# Patient Record
Sex: Male | Born: 1995 | Race: White | Hispanic: No | Marital: Single | Smoking: Never smoker
Health system: Southern US, Community
[De-identification: ages and names within clinical notes are randomized; demographics above are authoritative.]

---
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
## 2015-05-01 ENCOUNTER — Encounter (HOSPITAL_COMMUNITY): Payer: Self-pay | Admitting: *Deleted

## 2015-05-01 ENCOUNTER — Emergency Department (HOSPITAL_COMMUNITY): Payer: BLUE CROSS/BLUE SHIELD

## 2015-05-01 ENCOUNTER — Emergency Department (HOSPITAL_COMMUNITY)
Admission: EM | Admit: 2015-05-01 | Discharge: 2015-05-02 | Disposition: A | Discharge status: Home / Self Care | Payer: BLUE CROSS/BLUE SHIELD | Attending: Emergency Medicine | Admitting: Emergency Medicine

## 2015-05-01 DIAGNOSIS — T368X5A Adverse effect of other systemic antibiotics, initial encounter: Secondary | ICD-10-CM | POA: Insufficient documentation

## 2015-05-01 DIAGNOSIS — Y998 Other external cause status: Secondary | ICD-10-CM | POA: Diagnosis not present

## 2015-05-01 DIAGNOSIS — T7840XA Allergy, unspecified, initial encounter: Secondary | ICD-10-CM | POA: Diagnosis not present

## 2015-05-01 DIAGNOSIS — Y9289 Other specified places as the place of occurrence of the external cause: Secondary | ICD-10-CM | POA: Diagnosis not present

## 2015-05-01 DIAGNOSIS — X58XXXA Exposure to other specified factors, initial encounter: Secondary | ICD-10-CM | POA: Diagnosis not present

## 2015-05-01 DIAGNOSIS — Y9389 Activity, other specified: Secondary | ICD-10-CM | POA: Diagnosis not present

## 2015-05-01 MED ORDER — DIPHENHYDRAMINE HCL 50 MG/ML IJ SOLN
12.5000 mg | Freq: Once | INTRAMUSCULAR | Status: AC
  Administered 2015-05-01: 12.5 mg via INTRAVENOUS
  Filled 2015-05-01: qty 1

## 2015-05-01 MED ORDER — METHYLPREDNISOLONE SODIUM SUCC 125 MG IJ SOLR
125.0000 mg | Freq: Once | INTRAMUSCULAR | Status: AC
  Administered 2015-05-01: 125 mg via INTRAVENOUS
  Filled 2015-05-01: qty 2

## 2015-05-01 MED ORDER — FAMOTIDINE IN NACL 20-0.9 MG/50ML-% IV SOLN
20.0000 mg | Freq: Once | INTRAVENOUS | Status: AC
  Administered 2015-05-01: 20 mg via INTRAVENOUS
  Filled 2015-05-01: qty 50

## 2015-05-01 NOTE — ED Notes (Signed)
Pt states he took a levaquin x pta and is c/o itching, swelling of the eyes and throat feels itchy and swollen

## 2015-05-01 NOTE — ED Notes (Signed)
Patient transported to X-ray 

## 2015-05-01 NOTE — ED Notes (Signed)
Pt reports acute onset of facial swelling and generalized pruritis this evening approx after taking his first dose of levaquin - pt states he has a had cold x2 months and his PCP just prescribed this for him, no known history of allergies prior to this evening. Pt in no acute distress, speaking complete sentences, a few hives noted to pt's upper back - pt also admits to some tightness in throat however does not feel swollen, no hoarseness noted. Pt took claritin at home pta.

## 2015-05-02 MED ORDER — PREDNISONE 20 MG PO TABS: ORAL_TABLET | ORAL | Status: DC

## 2015-05-02 NOTE — ED Notes (Signed)
Pt ambulating independently w/ steady gait on d/c in no acute distress, A&Ox4. D/c instructions reviewed w/ pt and family - pt and family deny any further questions or concerns at present. Rx given x1  

## 2015-05-02 NOTE — Discharge Instructions (Signed)
Drug Allergy °A drug allergy means you have a strange reaction to a medicine. You may have puffiness (swelling), itching, red rashes, and hives. Some allergic reactions can be life-threatening. °HOME CARE  °If you do not know what caused your reaction: °· Write down medicines you use. °· Write down any problems you have after using medicine. °· Avoid things that cause a reaction. °· You can see an allergy doctor to be tested for allergies. °If you have hives or a rash: °· Take medicine as told by your doctor. °· Place cold cloths on your skin. °· Do not take hot baths or hot showers. Take baths in cool water. °If you are severely allergic: °· Wear a medical bracelet or necklace that lists your allergy. °· Carry your allergy kit or medicine shot to treat severe allergic reactions with you. These can save your life. °· Do not drive until medicine from your shot has worn off, unless your doctor says it is okay. °GET HELP RIGHT AWAY IF:  °· Your mouth is puffy, or you have trouble breathing. °· You have a tight feeling in your chest or throat. °· You have hives, puffiness, or itching all over your body. °· You throw up (vomit) or have watery poop (diarrhea). °· You feel dizzy or pass out (faint). °· You think you are having a reaction. Problems often start within 30 minutes after taking a medicine. °· You are getting worse, not better. °· You have new problems. °· Your problems go away and then come back. °This is an emergency. Use your medicine shot or allergy kit as told. Call your local emergency services (911 in [place] after the shot. Even if you feel better after the shot, you need to go to the hospital. You may need more medicine to control a severe reaction. °MAKE SURE YOU: °· Understand these instructions. °· Will watch your condition. °· Will get help right away if you are not doing well or get worse. °Document Released: 01/16/2005 Document Revised: 03/02/2012 Document Reviewed: 06/06/2011 °ExitCare® Patient  Information ©2015 [employer]. This information is not intended to replace advice given to you by your health care provider. Make sure you discuss any questions you have with your health care provider. ° °

## 2015-05-03 NOTE — ED Provider Notes (Signed)
CSN: [id number]     Arrival date & time 05/01/15  2152 History   First MD Initiated Contact with Patient 05/01/15 2250     Chief Complaint  Patient presents with  . Allergic Reaction     (Consider location/radiation/quality/duration/timing/severity/associated sxs/prior Treatment) HPI  Jamie Waller is a 19 y.o. male who presents to the Emergency Department complaining of sudden onset of red splotchy rash, facial swelling around his eyes, and generalized itching that began 45 minutes after taking Levaquin for first time.  He states that he was treated for "cold" type symptoms for two months and given the antibiotic for a possible sinus infection.  He reports developing itching and swelling around his eyes and feeling tightness in his throat.  He tool a Claritin with some relief of his symptoms prior to ED arrival.  He denies shortness of breath, difficulty swallowing, lip swelling, chest pain, or vomiting.    History reviewed. No pertinent past medical history. History reviewed. No pertinent past surgical history. History reviewed. No pertinent family history. History  Substance Use Topics  . Smoking status: Never Smoker   . Smokeless tobacco: Not on file  . Alcohol Use: No    Review of Systems  Constitutional: Negative for fever, chills, activity change and appetite change.  HENT: Positive for congestion, facial swelling, rhinorrhea and sneezing. Negative for sore throat, trouble swallowing and voice change.   Respiratory: Negative for cough, shortness of breath and wheezing.   Gastrointestinal: Negative for nausea, vomiting and abdominal pain.  Musculoskeletal: Negative for back pain and neck pain.  Skin: Positive for rash.  Allergic/Immunologic: Negative for food allergies and immunocompromised state.  Neurological: Negative for dizziness, syncope and headaches.  All other systems reviewed and are negative.     Allergies  Levaquin  Home Medications   Prior to  Admission medications   Medication Sig Start Date End Date Taking? Authorizing Provider  Minocycline HCl (MINOCIN PO) Take by mouth.    Historical Provider, MD  predniSONE (DELTASONE) 20 MG tablet Two tabs po qd x 5 days 05/02/15   Mishael Krysiak, PA-C   BP 119/78 mmHg  Pulse 50  Temp(Src) 98 F (36.7 C) (Oral)  Resp 20  Ht 6\' 1"  (1.854 m)  Wt 180 lb (81.647 kg)  BMI 23.75 kg/m2  SpO2 100% Physical Exam  Constitutional: He appears well-developed and well-nourished. No distress.  HENT:  Head: Normocephalic and atraumatic.  Right Ear: Tympanic membrane and ear canal normal.  Left Ear: Tympanic membrane and ear canal normal.  Nose: Mucosal edema and rhinorrhea present.  Mouth/Throat: Uvula is midline, oropharynx is clear and moist and mucous membranes are normal. No trismus in the jaw. No uvula swelling.  Airway patent , no edema.  Mild STS of the bilateral periorbital edema  Eyes: Conjunctivae and EOM are normal. Pupils are equal, round, and reactive to light.  Neck: Normal range of motion. Neck supple.  Cardiovascular: Normal rate, regular rhythm, normal heart sounds and intact distal pulses.   No murmur heard. Pulmonary/Chest: Effort normal and breath sounds normal. No respiratory distress. He has no wheezes. He has no rales.  Abdominal: Soft. He exhibits no distension. There is no tenderness. There is no rebound.  Musculoskeletal: Normal range of motion.  Lymphadenopathy:    He has no cervical adenopathy.  Neurological: He is alert. He exhibits normal muscle tone. Coordination normal.  Skin: Skin is warm.  Mild dermagraphia present without rash on exam.  Psychiatric: He has a normal mood and  affect. Thought content normal.  Nursing note and vitals reviewed.   ED Course  Procedures (including critical care time) Labs Review Labs Reviewed - No data to display  Imaging Review Dg Chest 2 View  05/02/2015   CLINICAL DATA:  Allergic reaction  EXAM: CHEST  2 VIEW  COMPARISON:   None.  FINDINGS: The heart size and mediastinal contours are within normal limits. Both lungs are clear. The visualized skeletal structures are unremarkable.  IMPRESSION: No active cardiopulmonary disease.   Electronically Signed   By: Ellery Plunkaniel R Mitchell M.D.   On: 05/02/2015 00:02     EKG Interpretation None      MDM   Final diagnoses:  Allergic reaction caused by a drug    Patient with likely allergic rxn to Levaquin.  Airway has remained patent.  No angioedema.    He is feeling better after IV medications and symptoms appear to be improving.  He appears stable for d/c and agrees to close PMD f/u and given restrict return precautions.      Pauline Ausammy Fawaz Borquez, PA-C 05/03/15 1601  Benjiman CoreNathan Pickering, MD 05/04/15 [phone]

## 2015-12-26 IMAGING — DX DG CHEST 2V
2 series · 2 of 2 positions shown · non-contrast
Comparison: None.

CLINICAL DATA: Allergic reaction

EXAM:
CHEST  2 VIEW

[chest pa]
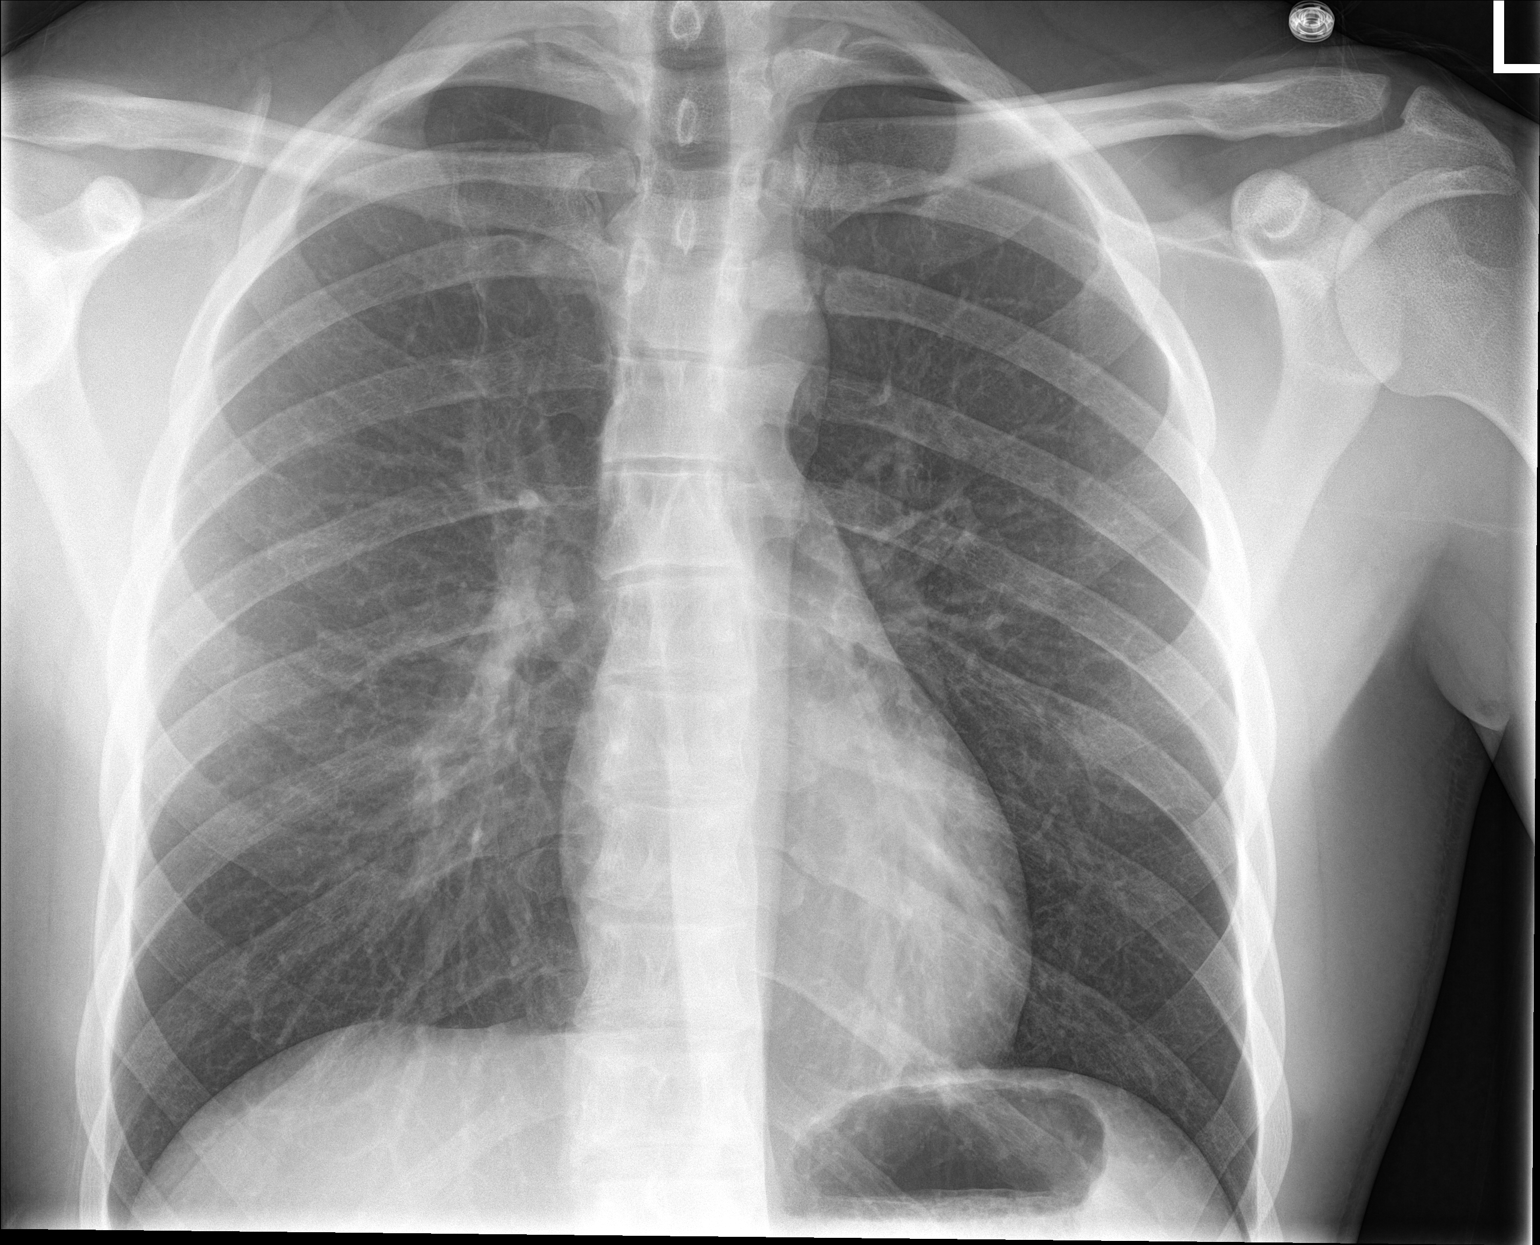

[chest lat]
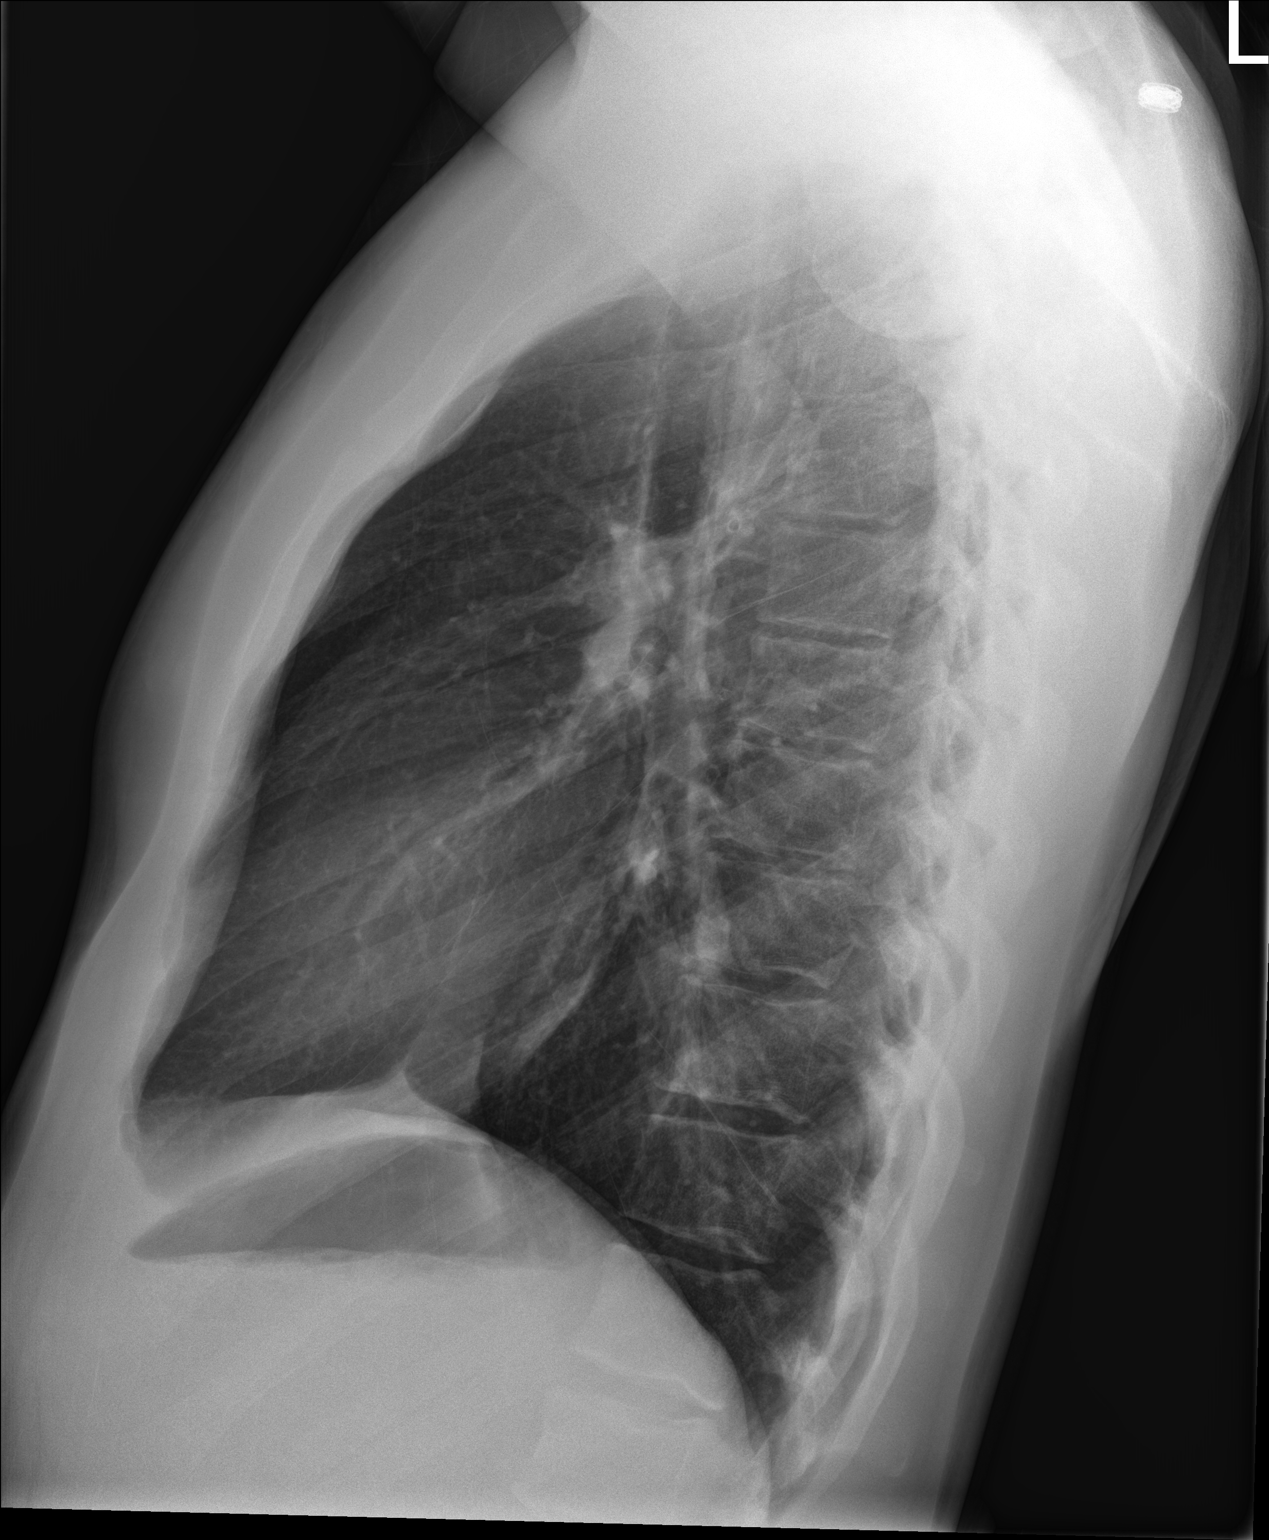

[2 of 2 positions shown; findings below may reference images not displayed]

FINDINGS: The heart size and mediastinal contours are within normal limits.
Both lungs are clear. The visualized skeletal structures are
unremarkable.
IMPRESSION: No active cardiopulmonary disease.

## 2018-04-14 ENCOUNTER — Telehealth: Payer: Self-pay

## 2018-04-14 NOTE — Telephone Encounter (Signed)
Patient mother states pt complained to her last night he was having chest pain and sob after eating on going for a month now. I spoke with Dr. Brett CanalesSteve regarding this advised we had no availability to see pt today,but mother of pt wants an appt for later in the week as the pt is not with her currently.Per Dr.Steve he thinks appt later at next available with be ok. I advised the mother is feels worse between now and then take to the ed.

## 2018-04-14 NOTE — Telephone Encounter (Signed)
ok 

## 2018-04-21 ENCOUNTER — Ambulatory Visit (INDEPENDENT_AMBULATORY_CARE_PROVIDER_SITE_OTHER): Payer: Managed Care, Other (non HMO) | Admitting: Family Medicine

## 2018-04-21 VITALS — BP 132/68 | Ht 73.0 in | Wt 199.4 lb

## 2018-04-21 DIAGNOSIS — R079 Chest pain, unspecified: Secondary | ICD-10-CM

## 2018-04-21 MED ORDER — PANTOPRAZOLE SODIUM 40 MG PO TBEC: 40.0000 mg | DELAYED_RELEASE_TABLET | Freq: Every day | ORAL | 3 refills | Status: AC

## 2018-04-21 MED ORDER — SUCRALFATE 1 G PO TABS: ORAL_TABLET | ORAL | 0 refills | Status: AC

## 2018-04-21 NOTE — Patient Instructions (Signed)
Gastroesophageal Reflux Disease, Adult Normally, food travels down the esophagus and stays in the stomach to be digested. However, when a person has gastroesophageal reflux disease (GERD), food and stomach acid move back up into the esophagus. When this happens, the esophagus becomes sore and inflamed. Over time, GERD can create small holes (ulcers) in the lining of the esophagus. What are the causes? This condition is caused by a problem with the muscle between the esophagus and the stomach (lower esophageal sphincter, or LES). Normally, the LES muscle closes after food passes through the esophagus to the stomach. When the LES is weakened or abnormal, it does not close properly, and that allows food and stomach acid to go back up into the esophagus. The LES can be weakened by certain dietary substances, medicines, and medical conditions, including:  Tobacco use.  Pregnancy.  Having a hiatal hernia.  Heavy alcohol use.  Certain foods and beverages, such as coffee, chocolate, onions, and peppermint.  What increases the risk? This condition is more likely to develop in:  People who have an increased body weight.  People who have connective tissue disorders.  People who use NSAID medicines.  What are the signs or symptoms? Symptoms of this condition include:  Heartburn.  Difficult or painful swallowing.  The feeling of having a lump in the throat.  Abitter taste in the mouth.  Bad breath.  Having a large amount of saliva.  Having an upset or bloated stomach.  Belching.  Chest pain.  Shortness of breath or wheezing.  Ongoing (chronic) cough or a night-time cough.  Wearing away of tooth enamel.  Weight loss.  Different conditions can cause chest pain. Make sure to see your health care provider if you experience chest pain. How is this diagnosed? Your health care provider will take a medical history and perform a physical exam. To determine if you have mild or severe  GERD, your health care provider may also monitor how you respond to treatment. You may also have other tests, including:  An endoscopy toexamine your stomach and esophagus with a small camera.  A test thatmeasures the acidity level in your esophagus.  A test thatmeasures how much pressure is on your esophagus.  A barium swallow or modified barium swallow to show the shape, size, and functioning of your esophagus.  How is this treated? The goal of treatment is to help relieve your symptoms and to prevent complications. Treatment for this condition may vary depending on how severe your symptoms are. Your health care provider may recommend:  Changes to your diet.  Medicine.  Surgery.  Follow these instructions at home: Diet  Follow a diet as recommended by your health care provider. This may involve avoiding foods and drinks such as: ? Coffee and tea (with or without caffeine). ? Drinks that containalcohol. ? Energy drinks and sports drinks. ? Carbonated drinks or sodas. ? Chocolate and cocoa. ? Peppermint and mint flavorings. ? Garlic and onions. ? Horseradish. ? Spicy and acidic foods, including peppers, chili powder, curry powder, vinegar, hot sauces, and barbecue sauce. ? Citrus fruit juices and citrus fruits, such as oranges, lemons, and limes. ? Tomato-based foods, such as red sauce, chili, salsa, and pizza with red sauce. ? Fried and fatty foods, such as donuts, french fries, potato chips, and high-fat dressings. ? High-fat meats, such as hot dogs and fatty cuts of red and white meats, such as rib eye steak, sausage, ham, and bacon. ? High-fat dairy items, such as whole milk,   butter, and cream cheese.  Eat small, frequent meals instead of large meals.  Avoid drinking large amounts of liquid with your meals.  Avoid eating meals during the 2-3 hours before bedtime.  Avoid lying down right after you eat.  Do not exercise right after you eat. General  instructions  Pay attention to any changes in your symptoms.  Take over-the-counter and prescription medicines only as told by your health care provider. Do not take aspirin, ibuprofen, or other NSAIDs unless your health care provider told you to do so.  Do not use any tobacco products, including cigarettes, chewing tobacco, and e-cigarettes. If you need help quitting, ask your health care provider.  Wear loose-fitting clothing. Do not wear anything tight around your waist that causes pressure on your abdomen.  Raise (elevate) the head of your bed 6 inches (15cm).  Try to reduce your stress, such as with yoga or meditation. If you need help reducing stress, ask your health care provider.  If you are overweight, reduce your weight to an amount that is healthy for you. Ask your health care provider for guidance about a safe weight loss goal.  Keep all follow-up visits as told by your health care provider. This is important. Contact a health care provider if:  You have new symptoms.  You have unexplained weight loss.  You have difficulty swallowing, or it hurts to swallow.  You have wheezing or a persistent cough.  Your symptoms do not improve with treatment.  You have a hoarse voice. Get help right away if:  You have pain in your arms, neck, jaw, teeth, or back.  You feel sweaty, dizzy, or light-headed.  You have chest pain or shortness of breath.  You vomit and your vomit looks like blood or coffee grounds.  You faint.  Your stool is bloody or black.  You cannot swallow, drink, or eat. This information is not intended to replace advice given to you by your health care provider. Make sure you discuss any questions you have with your health care provider. Document Released: 09/18/2005 Document Revised: 05/08/2016 Document Reviewed: 04/05/2015 Elsevier Interactive Patient Education  2018 [employer]  

## 2018-04-21 NOTE — Progress Notes (Signed)
   Subjective:    Patient ID: Jamie Waller, male    DOB: 1996-07-09, 22 y.o.   MRN: [id number]  Chest Pain   This is a new problem. The current episode started more than 1 month ago. The quality of the pain is described as tightness. The pain radiates to the left shoulder, left arm and upper back. Associated symptoms include back pain and shortness of breath. The pain is aggravated by food (lying down). He has tried antacids for the symptoms. The treatment provided mild relief. 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 Hopefully the years, reasonably well for the so gets all test later in the spring to try to pull.  With many years   uses occa  aspirin  Review of Systems  Respiratory: Positive for shortness of breath.   Cardiovascular: Positive for chest pain.  Musculoskeletal: Positive for back pain.       Objective:   Physical Exam  Alert and oriented, vitals reviewed and stable, NAD ENT-TM's and ext canals WNL bilat via otoscopic exam Soft palate, tonsils and post pharynx WNL via oropharyngeal exam Neck-symmetric, no masses; thyroid nonpalpable and nontender Pulmonary-no tachypnea or accessory muscle use; Clear without wheezes via auscultation Card--no abnrml murmurs, rhythm reg and rate WNL Carotid pulses symmetric, without bruits  EKG normal sinus rhythm.  No significant ST-T changes     Assessment & Plan:   1 impression chest pain.  Highly doubt cardiac etiology.  When originally presented started more postprandial.  Worse in the evening.  Was generalized.  Some symptoms of reflux.  Long discussion held.  Before major work-ups/referrals etc. I think reasonable to press on for a reflux esophagitis etiology.  Will initiate appropriate medication.  Educational information given.  H. pylori recommended yes 1 today, he has sure sure he rather low risk   Greater than 50% of this 25 minute face to face visit was spent in counseling and discussion and coordination of care regarding the above diagnosis/diagnosies

## 2018-04-23 LAB — HELICOBACTER PYLORI ABS-IGG+IGA, BLD: H. pylori, IgG AbS: <0.8 Index Value (ref 0.00–0.79)

## 2018-04-27 ENCOUNTER — Encounter: Payer: Self-pay | Admitting: Family Medicine

## 2018-05-12 ENCOUNTER — Ambulatory Visit (INDEPENDENT_AMBULATORY_CARE_PROVIDER_SITE_OTHER): Payer: Managed Care, Other (non HMO) | Admitting: Family Medicine

## 2018-05-12 ENCOUNTER — Encounter: Payer: Self-pay | Admitting: Family Medicine

## 2018-05-12 VITALS — BP 114/72 | Ht 73.0 in | Wt 200.8 lb

## 2018-05-12 DIAGNOSIS — K219 Gastro-esophageal reflux disease without esophagitis: Secondary | ICD-10-CM

## 2018-05-12 NOTE — Progress Notes (Signed)
   Subjective:    Patient ID: Jamie Waller, male    DOB: 02/23/1996, 22 y.o.   MRN: [id number]  HPI  Patient arrives for a follow up on reflux. Patient states he is doing better on medication.  Taking the med every day, has helped a whole lot  occas tightness in the throat area, overall doing a lot better  Patient compliant with medication.   and doing well.  Much less discomfort.  No noticeable side effects from the medication.  Review of Systems No headache, no major weight loss or weight gain, no chest pain no back pain abdominal pain no change in bowel habits complete ROS otherwise negative     Objective:   Physical Exam Alert vitals stable, NAD. Blood pressure good on repeat. HEENT normal. Lungs clear. Heart regular rate and rhythm. Abdomen no significant tenderness       Assessment & Plan:  Impression reflux clinically improved.  Tolerating the medication well.  Encouraged to switch to every other day Protonix for months and every third day if symptoms go away hold off on medicine and then use as needed.  Rationale discussed

## 2018-05-12 NOTE — Patient Instructions (Addendum)
After the first mont, you will finish the carafate, then try to back off you protonix to every other morning for a month, then back off to every third day   If after month of this doing well then stop and use as needed  Long term this may or may not work, you may end up needing med like this evey day but hte experts encourage [place] to try to use it more sparingly

## 2018-07-21 ENCOUNTER — Encounter: Payer: Self-pay | Admitting: Nurse Practitioner

## 2018-07-21 ENCOUNTER — Ambulatory Visit (INDEPENDENT_AMBULATORY_CARE_PROVIDER_SITE_OTHER): Payer: Managed Care, Other (non HMO) | Admitting: Nurse Practitioner

## 2018-07-21 VITALS — BP 116/72 | Temp 98.9°F | Ht 73.0 in | Wt 193.0 lb

## 2018-07-21 DIAGNOSIS — J029 Acute pharyngitis, unspecified: Secondary | ICD-10-CM | POA: Diagnosis not present

## 2018-07-21 LAB — POCT RAPID STREP A (OFFICE): RAPID STREP A SCREEN: NEGATIVE

## 2018-07-21 MED ORDER — AZITHROMYCIN 250 MG PO TABS: ORAL_TABLET | ORAL | 0 refills | Status: AC

## 2018-07-22 ENCOUNTER — Encounter: Payer: Self-pay | Admitting: Nurse Practitioner

## 2018-07-22 LAB — STREP A DNA PROBE: STREP GP A DIRECT, DNA PROBE: NEGATIVE

## 2018-07-22 LAB — SPECIMEN STATUS REPORT

## 2018-07-22 NOTE — Progress Notes (Signed)
Subjective: Presents for complaints of an illness that began about 10 days ago.  Began with sudden onset headache after being outside in the heat, states he stopped sweating.  Was worried about heat stroke.  Patient got into some air conditioning and took a cool shower.  Had a headache daily for about 3 to 4 days which has resolved.  Over the past several days has developed low-grade fever, max temp 99.6.  Worsening sore throat yesterday.  Headache.  No cough runny nose or ear pain.  No wheezing.  Nausea with vomiting x1.  No diarrhea or abdominal pain.  No rash.  Taking fluids well.  Voiding normal limit.  Objective:   BP 116/72   Temp 98.9 F (37.2 C) (Oral)   Ht 6\' 1"  (1.854 m)   Wt 193 lb (87.5 kg)   BMI 25.46 kg/m  NAD.  Alert, oriented.  TMs mild clear effusion, no erythema.  Pharynx moderately erythematous, RST negative.  Neck supple with mild soft anterior adenopathy.  Lungs clear.  Heart regular rate rhythm.  Assessment:  Acute pharyngitis, unspecified etiology - Plan: POCT rapid strep A, Strep A DNA probe    Plan:   Meds ordered this encounter  Medications  . azithromycin (ZITHROMAX Z-PAK) 250 MG tablet    Sig: Take 2 tablets (500 mg) on  Day 1,  followed by 1 tablet (250 mg) once daily on Days 2 through 5.    Dispense:  6 each    Refill:  0    Order Specific Question:   Supervising Provider    Answer:   Merlyn AlbertLUKING, WILLIAM S [[id number]]   Throat culture pending.  OTC meds as directed for symptomatic care.  Discussed prevention of heat related illness.  Call back by the end of the week if no improvement, sooner if worse.

## 2019-01-29 ENCOUNTER — Telehealth: Payer: Self-pay | Admitting: Family Medicine

## 2019-01-29 ENCOUNTER — Other Ambulatory Visit: Payer: Self-pay | Admitting: *Deleted

## 2019-01-29 MED ORDER — OSELTAMIVIR PHOSPHATE 75 MG PO CAPS: 75.0000 mg | ORAL_CAPSULE | Freq: Every day | ORAL | 0 refills | Status: AC

## 2019-01-29 NOTE — Telephone Encounter (Signed)
tamiflu 75 qqd for ten d

## 2019-01-29 NOTE — Telephone Encounter (Signed)
Pt has been exposed to the flu, not showing any signs and symptoms at this time, mom would like to start preventive tamiflu if possible to prevent, advise.   Pharmacy:  [facility] [phone] - [place], [place] - [address] AT [facility] [address]

## 2019-01-29 NOTE — Telephone Encounter (Signed)
Med sent to pharm. Pt notified.  

## 2020-01-19 ENCOUNTER — Encounter: Payer: Self-pay | Admitting: Family Medicine

## 2020-01-20 ENCOUNTER — Encounter: Payer: Self-pay | Admitting: Family Medicine
